# Patient Record
Sex: Male | Born: 1995 | Hispanic: No | Marital: Single
Health system: Southern US, Community
[De-identification: ages and names within clinical notes are randomized; demographics above are authoritative.]

---
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
## 2018-12-30 ENCOUNTER — Ambulatory Visit: Payer: Managed Care, Other (non HMO) | Attending: Internal Medicine

## 2018-12-30 DIAGNOSIS — Z20822 Contact with and (suspected) exposure to covid-19: Secondary | ICD-10-CM

## 2018-12-31 LAB — NOVEL CORONAVIRUS, NAA: SARS-CoV-2, NAA: NOT DETECTED

## 2019-01-28 ENCOUNTER — Ambulatory Visit: Payer: 59 | Attending: Internal Medicine

## 2019-01-28 DIAGNOSIS — Z20822 Contact with and (suspected) exposure to covid-19: Secondary | ICD-10-CM

## 2019-01-30 LAB — NOVEL CORONAVIRUS, NAA: SARS-CoV-2, NAA: DETECTED — AB

## 2019-05-04 ENCOUNTER — Ambulatory Visit: Payer: BC Managed Care – PPO | Attending: Family Medicine

## 2019-05-04 ENCOUNTER — Other Ambulatory Visit: Payer: Self-pay

## 2019-05-04 DIAGNOSIS — M545 Low back pain, unspecified: Secondary | ICD-10-CM
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
## 2019-05-11 ENCOUNTER — Ambulatory Visit: Payer: BC Managed Care – PPO

## 2019-05-13 ENCOUNTER — Ambulatory Visit: Payer: BC Managed Care – PPO

## 2019-12-29 ENCOUNTER — Other Ambulatory Visit: Payer: BC Managed Care – PPO

## 2019-12-29 DIAGNOSIS — Z20822 Contact with and (suspected) exposure to covid-19: Secondary | ICD-10-CM

## 2019-12-30 LAB — NOVEL CORONAVIRUS, NAA: SARS-CoV-2, NAA: NOT DETECTED

## 2019-12-30 LAB — SARS-COV-2, NAA 2 DAY TAT

## 2020-01-15 ENCOUNTER — Other Ambulatory Visit: Payer: Self-pay

## 2020-01-15 DIAGNOSIS — Z20822 Contact with and (suspected) exposure to covid-19: Secondary | ICD-10-CM

## 2020-01-18 LAB — NOVEL CORONAVIRUS, NAA: SARS-CoV-2, NAA: NOT DETECTED
